# Patient Record
Sex: Male | Born: 1935 | Race: White | Hispanic: No
Health system: Southern US, Community
[De-identification: ages and names within clinical notes are randomized; demographics above are authoritative.]

---

## 1997-12-13 ENCOUNTER — Ambulatory Visit: Admission: RE | Admit: 1997-12-13 | Discharge: 1997-12-13 | Payer: Self-pay | Admitting: *Deleted

## 2003-10-26 ENCOUNTER — Encounter: Payer: Self-pay | Admitting: Cardiology

## 2003-11-26 ENCOUNTER — Encounter: Payer: Self-pay | Admitting: Cardiology

## 2003-12-10 ENCOUNTER — Encounter: Payer: Self-pay | Admitting: General Practice

## 2003-12-26 ENCOUNTER — Encounter: Payer: Self-pay | Admitting: Cardiology

## 2003-12-26 ENCOUNTER — Encounter: Payer: Self-pay | Admitting: General Practice

## 2004-01-26 ENCOUNTER — Encounter: Payer: Self-pay | Admitting: Cardiology

## 2004-02-26 ENCOUNTER — Encounter: Payer: Self-pay | Admitting: Cardiology

## 2004-03-25 ENCOUNTER — Encounter: Payer: Self-pay | Admitting: Cardiology

## 2004-04-25 ENCOUNTER — Encounter: Payer: Self-pay | Admitting: Cardiology

## 2004-05-25 ENCOUNTER — Encounter: Payer: Self-pay | Admitting: Cardiology

## 2004-06-25 ENCOUNTER — Encounter: Payer: Self-pay | Admitting: Cardiology

## 2004-07-25 ENCOUNTER — Encounter: Payer: Self-pay | Admitting: Cardiology

## 2004-08-25 ENCOUNTER — Encounter: Payer: Self-pay | Admitting: Cardiology

## 2004-09-29 ENCOUNTER — Encounter: Payer: Self-pay | Admitting: Cardiology

## 2004-10-15 ENCOUNTER — Ambulatory Visit: Payer: Self-pay | Admitting: Internal Medicine

## 2004-10-25 ENCOUNTER — Encounter: Payer: Self-pay | Admitting: Cardiology

## 2004-11-25 ENCOUNTER — Encounter: Payer: Self-pay | Admitting: Cardiology

## 2004-12-31 ENCOUNTER — Encounter: Payer: Self-pay | Admitting: Cardiology

## 2005-01-22 ENCOUNTER — Ambulatory Visit: Payer: Self-pay | Admitting: Internal Medicine

## 2005-01-25 ENCOUNTER — Encounter: Payer: Self-pay | Admitting: Cardiology

## 2005-02-25 ENCOUNTER — Encounter: Payer: Self-pay | Admitting: Cardiology

## 2005-03-25 ENCOUNTER — Encounter: Payer: Self-pay | Admitting: Cardiology

## 2005-04-25 ENCOUNTER — Encounter: Payer: Self-pay | Admitting: Cardiology

## 2005-05-25 ENCOUNTER — Encounter: Payer: Self-pay | Admitting: Cardiology

## 2005-06-25 ENCOUNTER — Encounter: Payer: Self-pay | Admitting: Cardiology

## 2005-07-25 ENCOUNTER — Encounter: Payer: Self-pay | Admitting: Cardiology

## 2005-08-25 ENCOUNTER — Encounter: Payer: Self-pay | Admitting: Cardiology

## 2005-09-25 ENCOUNTER — Encounter: Payer: Self-pay | Admitting: Cardiology

## 2005-10-25 ENCOUNTER — Encounter: Payer: Self-pay | Admitting: Cardiology

## 2005-11-25 ENCOUNTER — Encounter: Payer: Self-pay | Admitting: Cardiology

## 2005-12-25 ENCOUNTER — Encounter: Payer: Self-pay | Admitting: Cardiology

## 2006-01-25 ENCOUNTER — Encounter: Payer: Self-pay | Admitting: Cardiology

## 2006-02-25 ENCOUNTER — Encounter: Payer: Self-pay | Admitting: Cardiology

## 2006-03-26 ENCOUNTER — Encounter: Payer: Self-pay | Admitting: Cardiology

## 2006-04-26 ENCOUNTER — Encounter: Payer: Self-pay | Admitting: Cardiology

## 2006-05-26 ENCOUNTER — Encounter: Payer: Self-pay | Admitting: Cardiology

## 2006-06-26 ENCOUNTER — Encounter: Payer: Self-pay | Admitting: Cardiology

## 2006-07-26 ENCOUNTER — Encounter: Payer: Self-pay | Admitting: Cardiology

## 2006-08-26 ENCOUNTER — Encounter: Payer: Self-pay | Admitting: Cardiology

## 2006-09-26 ENCOUNTER — Encounter: Payer: Self-pay | Admitting: Cardiology

## 2007-03-08 ENCOUNTER — Ambulatory Visit: Payer: Self-pay | Admitting: Internal Medicine

## 2007-05-12 ENCOUNTER — Ambulatory Visit: Payer: Self-pay | Admitting: Specialist

## 2008-06-03 ENCOUNTER — Ambulatory Visit: Payer: Self-pay | Admitting: Internal Medicine

## 2008-06-06 ENCOUNTER — Ambulatory Visit: Payer: Self-pay | Admitting: Internal Medicine

## 2008-06-10 ENCOUNTER — Encounter: Payer: Self-pay | Admitting: Internal Medicine

## 2008-06-19 ENCOUNTER — Ambulatory Visit: Payer: Self-pay | Admitting: Vascular Surgery

## 2008-06-25 ENCOUNTER — Encounter: Payer: Self-pay | Admitting: Internal Medicine

## 2008-07-05 ENCOUNTER — Ambulatory Visit: Payer: Self-pay | Admitting: Vascular Surgery

## 2008-08-08 ENCOUNTER — Ambulatory Visit: Payer: Self-pay | Admitting: Vascular Surgery

## 2008-08-13 ENCOUNTER — Inpatient Hospital Stay: Payer: Self-pay | Admitting: Vascular Surgery

## 2008-08-17 ENCOUNTER — Inpatient Hospital Stay: Payer: Self-pay | Admitting: Internal Medicine

## 2008-12-16 ENCOUNTER — Ambulatory Visit: Payer: Self-pay | Admitting: Specialist

## 2009-05-05 ENCOUNTER — Ambulatory Visit: Payer: Self-pay | Admitting: Specialist

## 2009-05-19 ENCOUNTER — Ambulatory Visit: Payer: Self-pay | Admitting: Specialist

## 2009-08-27 ENCOUNTER — Ambulatory Visit: Payer: Self-pay | Admitting: Vascular Surgery

## 2009-09-01 ENCOUNTER — Ambulatory Visit: Payer: Self-pay | Admitting: Internal Medicine

## 2009-09-24 ENCOUNTER — Ambulatory Visit: Payer: Self-pay | Admitting: Urology

## 2009-10-29 ENCOUNTER — Ambulatory Visit: Payer: Self-pay | Admitting: Specialist

## 2010-05-08 ENCOUNTER — Ambulatory Visit: Payer: Self-pay | Admitting: Specialist

## 2010-05-14 ENCOUNTER — Ambulatory Visit: Payer: Self-pay

## 2010-10-14 ENCOUNTER — Inpatient Hospital Stay: Payer: Self-pay | Admitting: Internal Medicine

## 2010-10-19 LAB — PATHOLOGY REPORT

## 2010-11-05 ENCOUNTER — Inpatient Hospital Stay: Payer: Self-pay | Admitting: Internal Medicine

## 2010-11-11 ENCOUNTER — Encounter: Payer: Self-pay | Admitting: Internal Medicine

## 2010-11-26 ENCOUNTER — Encounter: Payer: Self-pay | Admitting: Internal Medicine

## 2010-12-26 ENCOUNTER — Encounter: Payer: Self-pay | Admitting: Internal Medicine

## 2011-10-15 LAB — COMPREHENSIVE METABOLIC PANEL
Albumin: 3.3 g/dL — ABNORMAL LOW (ref 3.4–5.0)
Anion Gap: 10 (ref 7–16)
Bilirubin,Total: 0.8 mg/dL (ref 0.2–1.0)
Calcium, Total: 9.3 mg/dL (ref 8.5–10.1)
Chloride: 101 mmol/L (ref 98–107)
Co2: 29 mmol/L (ref 21–32)
EGFR (African American): 58 — ABNORMAL LOW
EGFR (Non-African Amer.): 50 — ABNORMAL LOW
Glucose: 130 mg/dL — ABNORMAL HIGH (ref 65–99)
Osmolality: 283 (ref 275–301)
Potassium: 3.6 mmol/L (ref 3.5–5.1)
SGOT(AST): 26 U/L (ref 15–37)
SGPT (ALT): 27 U/L (ref 12–78)
Total Protein: 7.6 g/dL (ref 6.4–8.2)

## 2011-10-15 LAB — URINALYSIS, COMPLETE
Blood: NEGATIVE
Glucose,UR: NEGATIVE mg/dL (ref 0–75)
Leukocyte Esterase: NEGATIVE
Nitrite: NEGATIVE
Protein: NEGATIVE
Specific Gravity: 1.015 (ref 1.003–1.030)
Squamous Epithelial: <1
WBC UR: <1 /HPF (ref 0–5)

## 2011-10-15 LAB — CBC
HGB: 15.8 g/dL (ref 13.0–18.0)
MCH: 32.8 pg (ref 26.0–34.0)
MCHC: 34.4 g/dL (ref 32.0–36.0)
Platelet: 276 10*3/uL (ref 150–440)

## 2011-10-15 LAB — MAGNESIUM: Magnesium: 2.2 mg/dL

## 2011-10-15 LAB — LIPASE, BLOOD: Lipase: 41 U/L — ABNORMAL LOW (ref 73–393)

## 2011-10-16 LAB — CBC WITH DIFFERENTIAL/PLATELET
Basophil #: 0.1 10*3/uL (ref 0.0–0.1)
Eosinophil %: 4.3 %
Lymphocyte #: 1.5 10*3/uL (ref 1.0–3.6)
Lymphocyte %: 24.2 %
MCV: 95 fL (ref 80–100)
Monocyte %: 9.7 %
Neutrophil #: 3.6 10*3/uL (ref 1.4–6.5)
Neutrophil %: 60.9 %
Platelet: 206 10*3/uL (ref 150–440)
RBC: 4.18 10*6/uL — ABNORMAL LOW (ref 4.40–5.90)
RDW: 14.1 % (ref 11.5–14.5)
WBC: 6 10*3/uL (ref 3.8–10.6)

## 2011-10-16 LAB — BASIC METABOLIC PANEL
Anion Gap: 8 (ref 7–16)
BUN: 17 mg/dL (ref 7–18)
Co2: 29 mmol/L (ref 21–32)
EGFR (Non-African Amer.): 55 — ABNORMAL LOW
Glucose: 92 mg/dL (ref 65–99)
Potassium: 3.2 mmol/L — ABNORMAL LOW (ref 3.5–5.1)
Sodium: 142 mmol/L (ref 136–145)

## 2011-10-16 LAB — HEMOGLOBIN: HGB: 13 g/dL (ref 13.0–18.0)

## 2011-10-17 ENCOUNTER — Inpatient Hospital Stay: Payer: Self-pay | Admitting: Internal Medicine

## 2011-10-17 LAB — BASIC METABOLIC PANEL
Anion Gap: 8 (ref 7–16)
Calcium, Total: 8 mg/dL — ABNORMAL LOW (ref 8.5–10.1)
Co2: 26 mmol/L (ref 21–32)
Creatinine: 1.14 mg/dL (ref 0.60–1.30)
EGFR (African American): >60
EGFR (Non-African Amer.): 58 — ABNORMAL LOW
Glucose: 94 mg/dL (ref 65–99)

## 2011-10-17 LAB — CBC WITH DIFFERENTIAL/PLATELET
Basophil #: 0 10*3/uL (ref 0.0–0.1)
Eosinophil #: 0.2 10*3/uL (ref 0.0–0.7)
Lymphocyte #: 1.3 10*3/uL (ref 1.0–3.6)
MCH: 31.9 pg (ref 26.0–34.0)
MCHC: 33.3 g/dL (ref 32.0–36.0)
MCV: 96 fL (ref 80–100)
Monocyte #: 0.5 x10 3/mm (ref 0.2–1.0)
Platelet: 199 10*3/uL (ref 150–440)
RDW: 14.1 % (ref 11.5–14.5)
WBC: 6.1 10*3/uL (ref 3.8–10.6)

## 2011-10-17 LAB — HEMOGLOBIN: HGB: 11.8 g/dL — ABNORMAL LOW (ref 13.0–18.0)

## 2011-10-18 LAB — CBC WITH DIFFERENTIAL/PLATELET
Basophil #: 0.1 10*3/uL (ref 0.0–0.1)
Eosinophil #: 0.2 10*3/uL (ref 0.0–0.7)
HCT: 34.6 % — ABNORMAL LOW (ref 40.0–52.0)
HGB: 11.6 g/dL — ABNORMAL LOW (ref 13.0–18.0)
Lymphocyte #: 1.6 10*3/uL (ref 1.0–3.6)
Lymphocyte %: 25.3 %
MCHC: 33.4 g/dL (ref 32.0–36.0)
Monocyte #: 0.5 x10 3/mm (ref 0.2–1.0)
Monocyte %: 8.4 %
Neutrophil #: 3.9 10*3/uL (ref 1.4–6.5)
Neutrophil %: 62.4 %
Platelet: 195 10*3/uL (ref 150–440)
RBC: 3.61 10*6/uL — ABNORMAL LOW (ref 4.40–5.90)

## 2011-10-19 LAB — HEMOGLOBIN: HGB: 11.8 g/dL — ABNORMAL LOW (ref 13.0–18.0)

## 2011-10-20 LAB — PATHOLOGY REPORT

## 2011-10-21 LAB — URINALYSIS, COMPLETE
Bacteria: NONE SEEN
Glucose,UR: NEGATIVE mg/dL (ref 0–75)
Nitrite: NEGATIVE
RBC,UR: 7 /HPF (ref 0–5)
Specific Gravity: 1.005 (ref 1.003–1.030)
WBC UR: 71 /HPF (ref 0–5)

## 2011-10-21 LAB — CBC WITH DIFFERENTIAL/PLATELET
Basophil #: 0 10*3/uL (ref 0.0–0.1)
Eosinophil #: 0 10*3/uL (ref 0.0–0.7)
HCT: 40.4 % (ref 40.0–52.0)
HGB: 13.6 g/dL (ref 13.0–18.0)
Lymphocyte %: 3.1 %
MCHC: 33.8 g/dL (ref 32.0–36.0)
Monocyte %: 7.1 %
Neutrophil #: 14.8 10*3/uL — ABNORMAL HIGH (ref 1.4–6.5)
Neutrophil %: 89.5 %
Platelet: 209 10*3/uL (ref 150–440)
RBC: 4.26 10*6/uL — ABNORMAL LOW (ref 4.40–5.90)
RDW: 14.2 % (ref 11.5–14.5)
WBC: 16.5 10*3/uL — ABNORMAL HIGH (ref 3.8–10.6)

## 2011-10-21 LAB — COMPREHENSIVE METABOLIC PANEL
Bilirubin,Total: 0.7 mg/dL (ref 0.2–1.0)
Calcium, Total: 8.6 mg/dL (ref 8.5–10.1)
Chloride: 109 mmol/L — ABNORMAL HIGH (ref 98–107)
Co2: 26 mmol/L (ref 21–32)
Creatinine: 1.77 mg/dL — ABNORMAL HIGH (ref 0.60–1.30)
EGFR (African American): 39 — ABNORMAL LOW
Glucose: 142 mg/dL — ABNORMAL HIGH (ref 65–99)
Osmolality: 291 (ref 275–301)
SGPT (ALT): 12 U/L (ref 12–78)
Total Protein: 6.2 g/dL — ABNORMAL LOW (ref 6.4–8.2)

## 2011-10-22 ENCOUNTER — Inpatient Hospital Stay: Payer: Self-pay | Admitting: Internal Medicine

## 2011-10-22 ENCOUNTER — Ambulatory Visit: Payer: Self-pay | Admitting: Internal Medicine

## 2011-10-23 LAB — CBC WITH DIFFERENTIAL/PLATELET
Basophil %: 0.4 %
Eosinophil #: 0.1 10*3/uL (ref 0.0–0.7)
Eosinophil %: 0.6 %
HGB: 11.5 g/dL — ABNORMAL LOW (ref 13.0–18.0)
Lymphocyte #: 0.7 10*3/uL — ABNORMAL LOW (ref 1.0–3.6)
Lymphocyte %: 6.2 %
MCV: 96 fL (ref 80–100)
Neutrophil #: 9.9 10*3/uL — ABNORMAL HIGH (ref 1.4–6.5)
Neutrophil %: 86.6 %
RBC: 3.71 10*6/uL — ABNORMAL LOW (ref 4.40–5.90)
WBC: 11.4 10*3/uL — ABNORMAL HIGH (ref 3.8–10.6)

## 2011-10-23 LAB — BASIC METABOLIC PANEL
BUN: 19 mg/dL — ABNORMAL HIGH (ref 7–18)
Calcium, Total: 8 mg/dL — ABNORMAL LOW (ref 8.5–10.1)
Chloride: 114 mmol/L — ABNORMAL HIGH (ref 98–107)
EGFR (Non-African Amer.): 48 — ABNORMAL LOW
Osmolality: 297 (ref 275–301)
Potassium: 3.6 mmol/L (ref 3.5–5.1)
Sodium: 148 mmol/L — ABNORMAL HIGH (ref 136–145)

## 2011-10-23 LAB — URINE CULTURE

## 2011-10-24 LAB — BASIC METABOLIC PANEL
Anion Gap: 10 (ref 7–16)
Chloride: 115 mmol/L — ABNORMAL HIGH (ref 98–107)
Co2: 21 mmol/L (ref 21–32)
Creatinine: 1.39 mg/dL — ABNORMAL HIGH (ref 0.60–1.30)
EGFR (Non-African Amer.): 46 — ABNORMAL LOW
Glucose: 128 mg/dL — ABNORMAL HIGH (ref 65–99)
Potassium: 4.1 mmol/L (ref 3.5–5.1)
Sodium: 146 mmol/L — ABNORMAL HIGH (ref 136–145)

## 2011-10-24 LAB — CULTURE, BLOOD (SINGLE)

## 2011-10-25 LAB — CBC WITH DIFFERENTIAL/PLATELET
Basophil #: 0 10*3/uL (ref 0.0–0.1)
Basophil %: 0.3 %
Eosinophil #: 0 10*3/uL (ref 0.0–0.7)
Eosinophil %: 0 %
HCT: 36.1 % — ABNORMAL LOW (ref 40.0–52.0)
Lymphocyte %: 8.2 %
MCH: 32.1 pg (ref 26.0–34.0)
MCHC: 33.7 g/dL (ref 32.0–36.0)
Monocyte #: 0.9 x10 3/mm (ref 0.2–1.0)
Neutrophil #: 10.4 10*3/uL — ABNORMAL HIGH (ref 1.4–6.5)
Neutrophil %: 84.5 %
RBC: 3.79 10*6/uL — ABNORMAL LOW (ref 4.40–5.90)
RDW: 14.6 % — ABNORMAL HIGH (ref 11.5–14.5)
WBC: 12.4 10*3/uL — ABNORMAL HIGH (ref 3.8–10.6)

## 2011-10-25 LAB — URINALYSIS, COMPLETE
Glucose,UR: NEGATIVE mg/dL (ref 0–75)
Nitrite: NEGATIVE
Protein: 30
Specific Gravity: 1.018 (ref 1.003–1.030)
Squamous Epithelial: NONE SEEN

## 2011-10-25 LAB — BASIC METABOLIC PANEL
Anion Gap: 13 (ref 7–16)
BUN: 28 mg/dL — ABNORMAL HIGH (ref 7–18)
Creatinine: 1.34 mg/dL — ABNORMAL HIGH (ref 0.60–1.30)
EGFR (African American): 55 — ABNORMAL LOW
Glucose: 132 mg/dL — ABNORMAL HIGH (ref 65–99)
Potassium: 4.3 mmol/L (ref 3.5–5.1)
Sodium: 149 mmol/L — ABNORMAL HIGH (ref 136–145)

## 2011-10-26 ENCOUNTER — Ambulatory Visit: Payer: Self-pay | Admitting: Internal Medicine

## 2011-10-27 LAB — BASIC METABOLIC PANEL
BUN: 24 mg/dL — ABNORMAL HIGH (ref 7–18)
Calcium, Total: 8.2 mg/dL — ABNORMAL LOW (ref 8.5–10.1)
Chloride: 114 mmol/L — ABNORMAL HIGH (ref 98–107)
EGFR (African American): >60
Osmolality: 298 (ref 275–301)
Potassium: 4.7 mmol/L (ref 3.5–5.1)
Sodium: 148 mmol/L — ABNORMAL HIGH (ref 136–145)

## 2011-10-29 LAB — CBC WITH DIFFERENTIAL/PLATELET
Basophil %: 0.3 %
Eosinophil #: 0.1 10*3/uL (ref 0.0–0.7)
Eosinophil %: 1.1 %
HCT: 33.6 % — ABNORMAL LOW (ref 40.0–52.0)
HGB: 11.3 g/dL — ABNORMAL LOW (ref 13.0–18.0)
Lymphocyte #: 1.1 10*3/uL (ref 1.0–3.6)
MCH: 31.5 pg (ref 26.0–34.0)
MCHC: 33.7 g/dL (ref 32.0–36.0)
MCV: 93 fL (ref 80–100)
Monocyte #: 0.6 x10 3/mm (ref 0.2–1.0)
Monocyte %: 6.8 %
Neutrophil #: 6.8 10*3/uL — ABNORMAL HIGH (ref 1.4–6.5)
RBC: 3.59 10*6/uL — ABNORMAL LOW (ref 4.40–5.90)

## 2011-10-29 LAB — BASIC METABOLIC PANEL
Anion Gap: 7 (ref 7–16)
Calcium, Total: 7.6 mg/dL — ABNORMAL LOW (ref 8.5–10.1)
Co2: 28 mmol/L (ref 21–32)
Creatinine: 1.06 mg/dL (ref 0.60–1.30)
EGFR (African American): >60
EGFR (Non-African Amer.): >60
Glucose: 125 mg/dL — ABNORMAL HIGH (ref 65–99)
Osmolality: 278 (ref 275–301)
Sodium: 139 mmol/L (ref 136–145)

## 2011-11-26 ENCOUNTER — Ambulatory Visit: Payer: Self-pay | Admitting: Internal Medicine

## 2011-11-26 DEATH — deceased

## 2012-02-26 IMAGING — XA IR VASCULAR PROCEDURE
15 of 24 series · 15 of 24 positions shown · IV contrast (IODINE)
Comparison: none

[Series 3: aorta · 1 of 2 slices shown (1 of 9)]
[im 1/2]
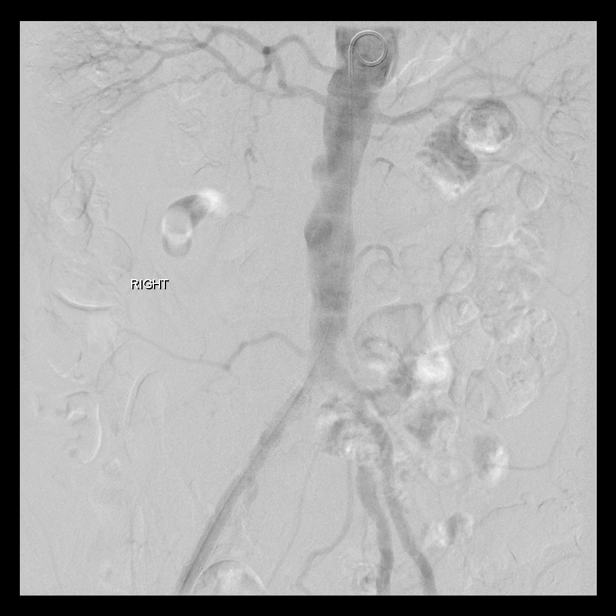

[Series 5: aorta · 1 of 2 slices shown (2 of 9)]
[im 1/2]
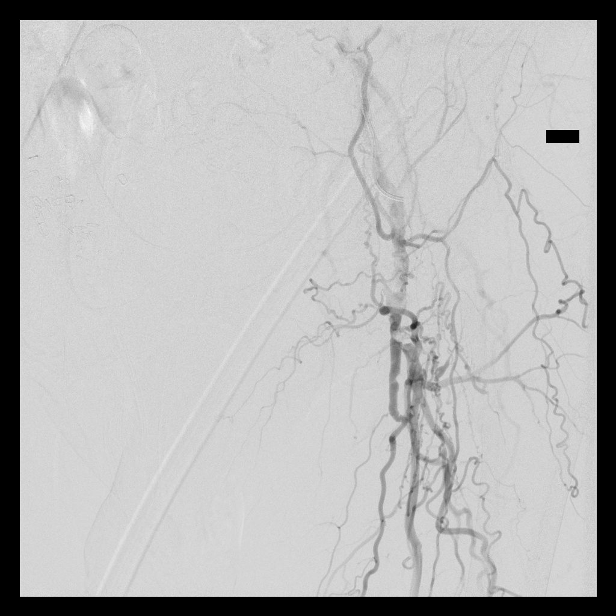

[Series 7: aorta · 1 of 2 slices shown (3 of 9)]
[im 1/2]
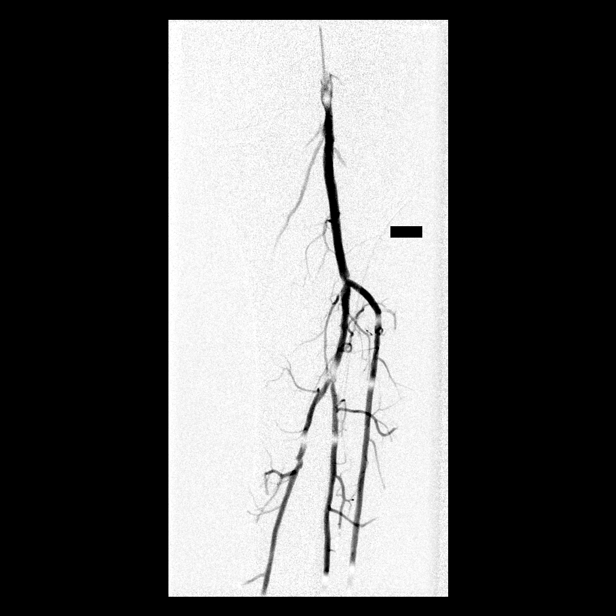

[Series 8: aorta · 1 of 2 slices shown (4 of 9)]
[im 1/2]
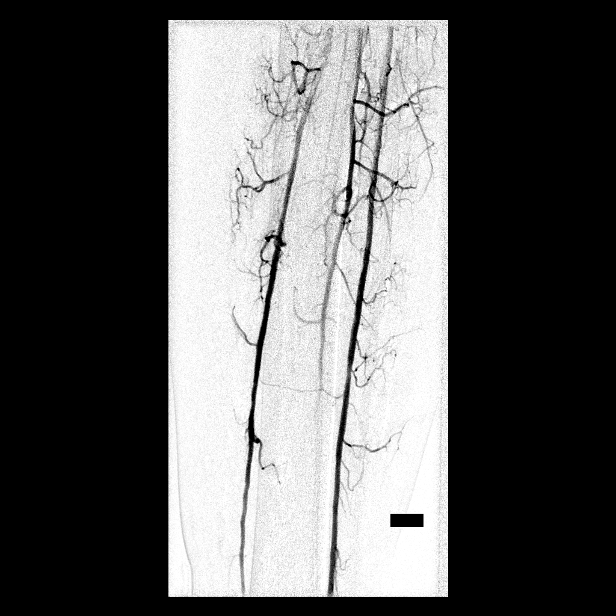

[Series 10: fl  angio · 1 of 1 slices shown (1 of 5)]
[im 1/1]
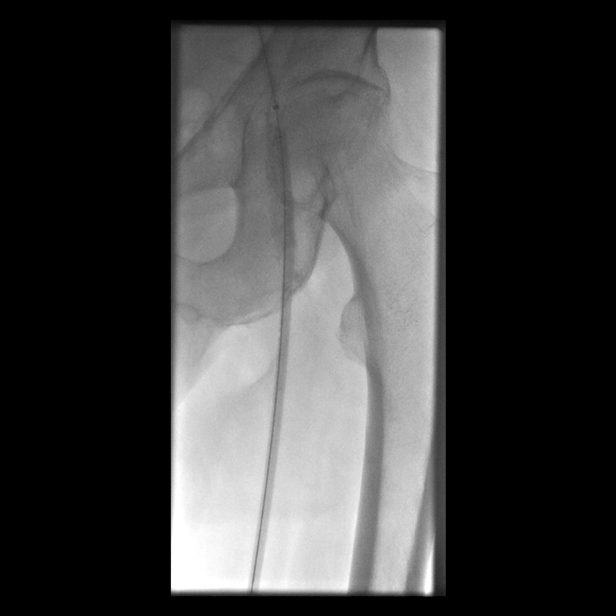

[Series 11: fl  angio · 1 of 2 slices shown (2 of 5)]
[im 1/2]
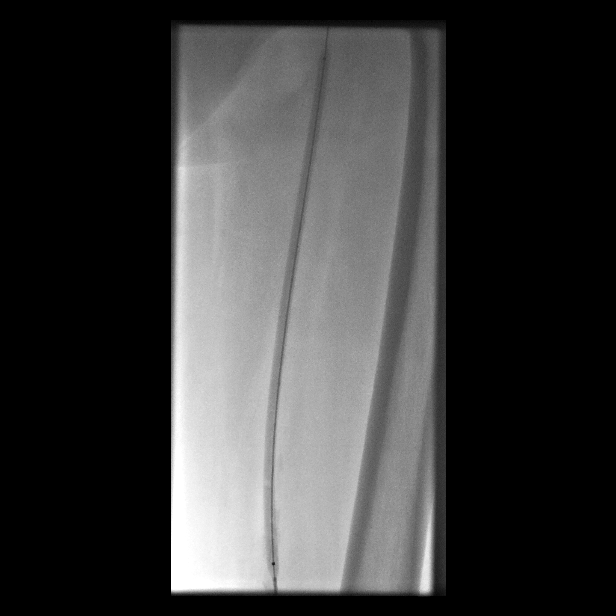

[Series 13: fl  angio · 1 of 1 slices shown (3 of 5)]
[im 1/1]
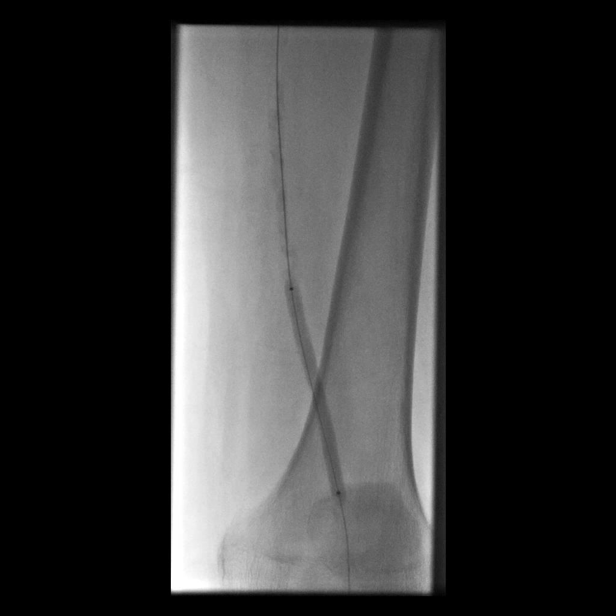

[Series 15: aorta · 1 of 2 slices shown (5 of 9)]
[im 1/2]
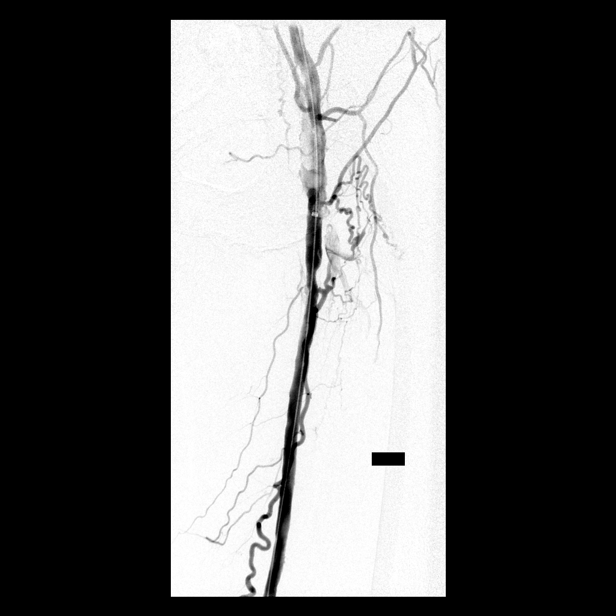

[Series 16: aorta · 1 of 2 slices shown (6 of 9)]
[im 1/2]
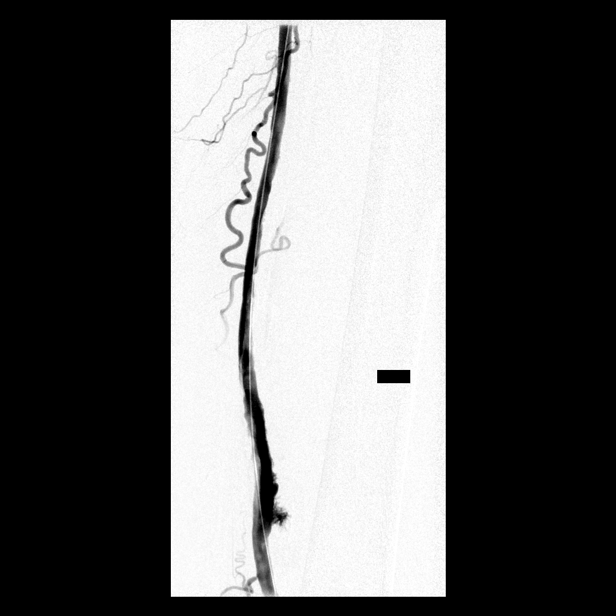

[Series 18: fl  angio · 1 of 1 slices shown (4 of 5)]
[im 1/1]
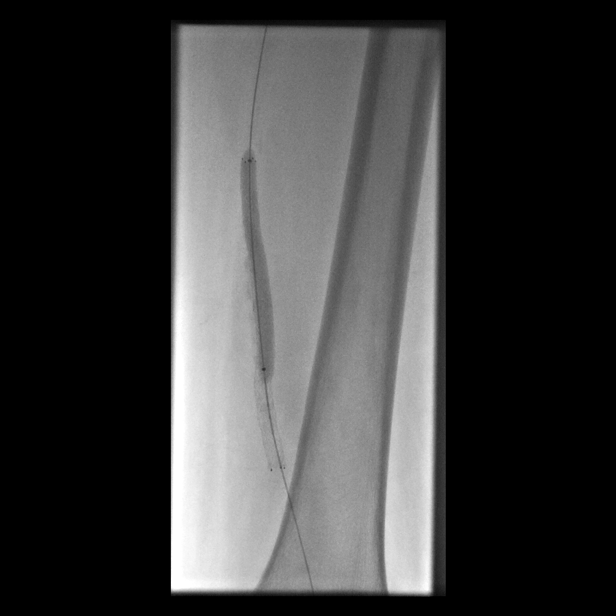

[Series 19: aorta · 1 of 2 slices shown (7 of 9)]
[im 1/2]
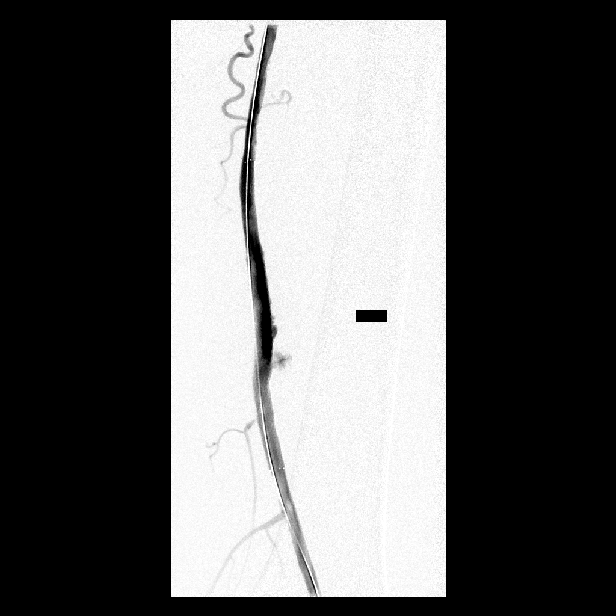

[Series 21: aorta · 1 of 2 slices shown (8 of 9)]
[im 1/2]
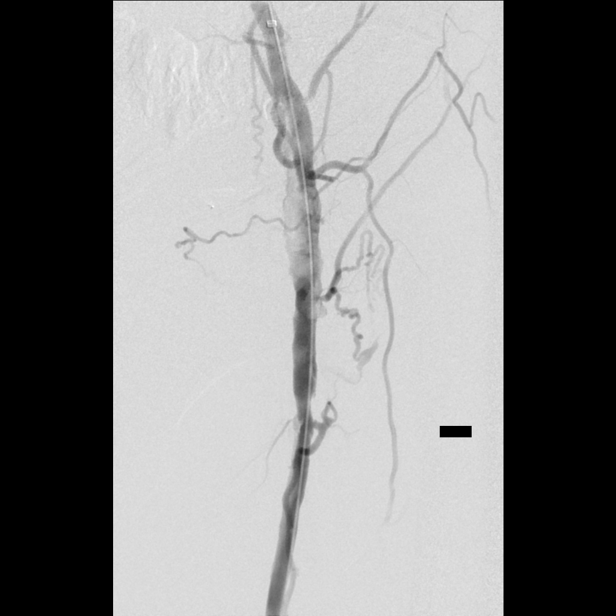

[Series 23: aorta · 1 of 2 slices shown (9 of 9)]
[im 1/2]
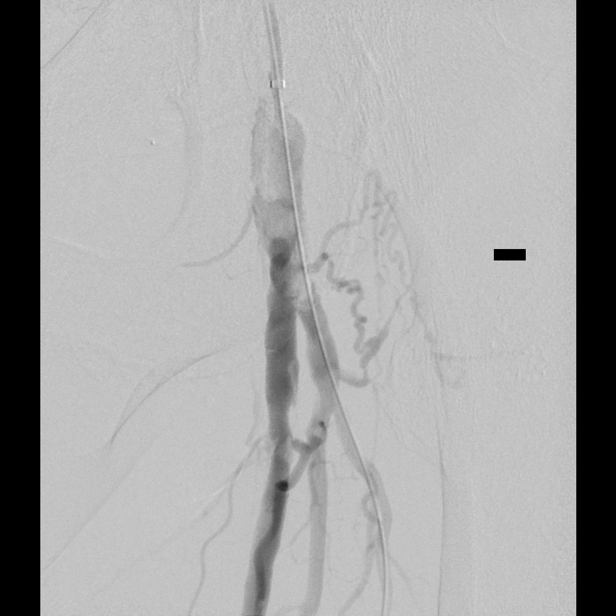

[Series 24: fl  angio · 1 of 1 slices shown (5 of 5)]
[im 1/1]
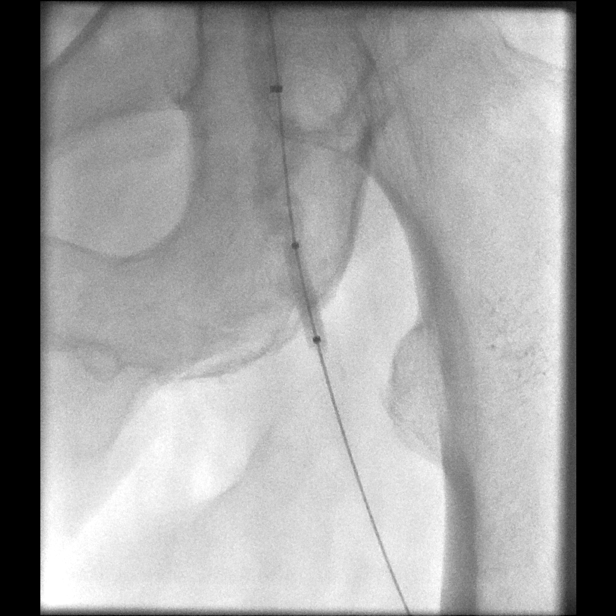

[Series 26: sfa · 1 of 2 slices shown]
[im 1/2]
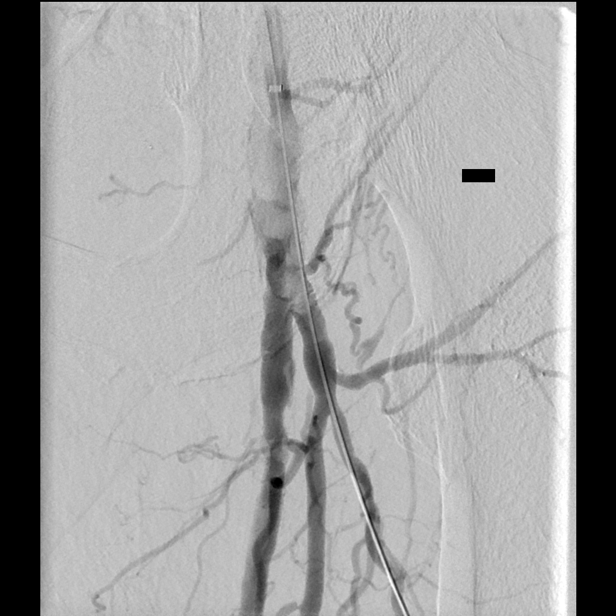

[15 of 24 positions shown; findings below may reference images not displayed]

IMAGES IMPORTED FROM THE [employer] WORKFLOW SYSTEM
NO DICTATION FOR STUDY

## 2012-03-02 IMAGING — US US EXTREM LOW VENOUS*L*
1 series · 17 of 24 positions shown · non-contrast
Comparison: none

REASON FOR EXAM: STAT CR PGER [id number] post procedure edema LEFT eval DVT
COMMENTS:

[Series 1: us extrem low venous*left* · 17 of 24 slices shown]
[im 1/24]
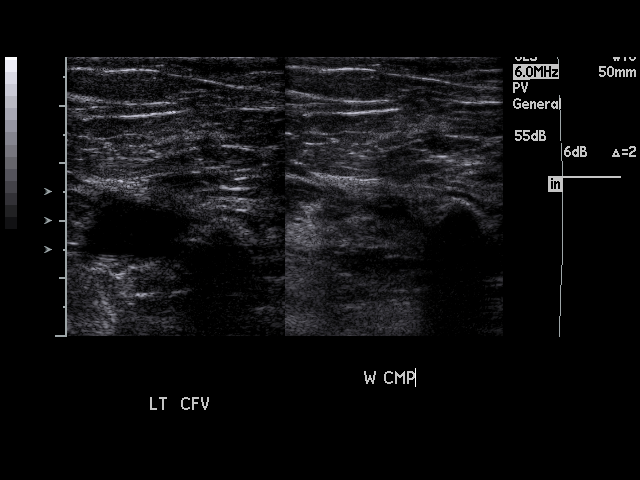
[im 3/24]
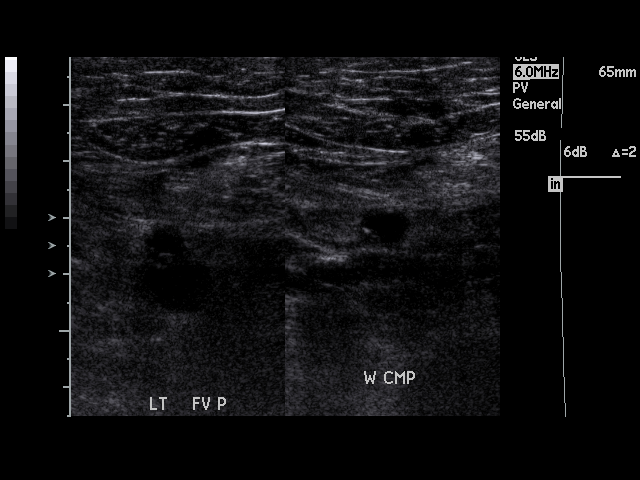
[im 4/24]
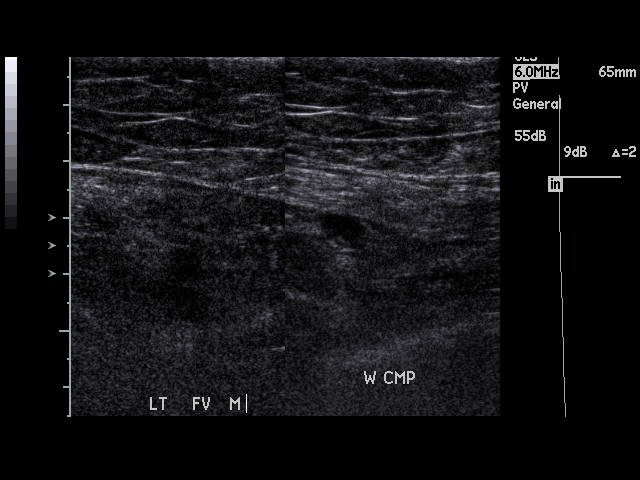
[im 5/24]
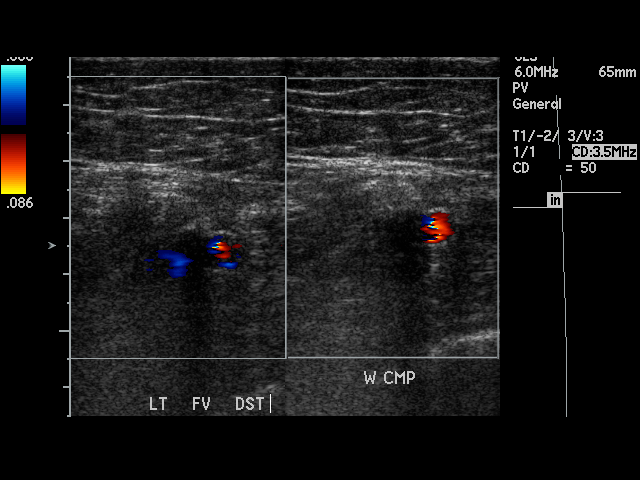
[im 7/24]
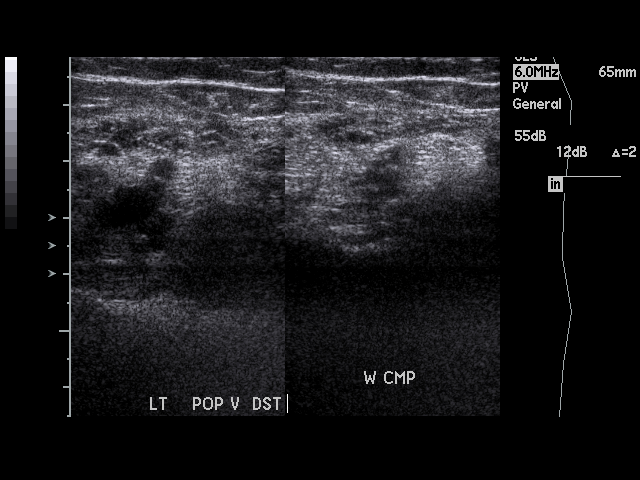
[im 8/24]
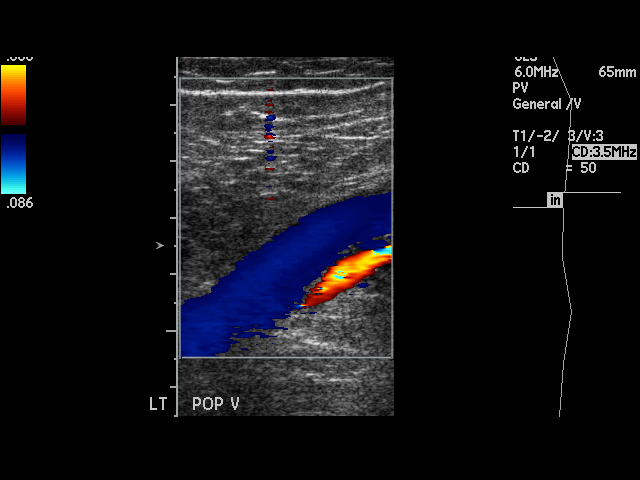
[im 10/24]
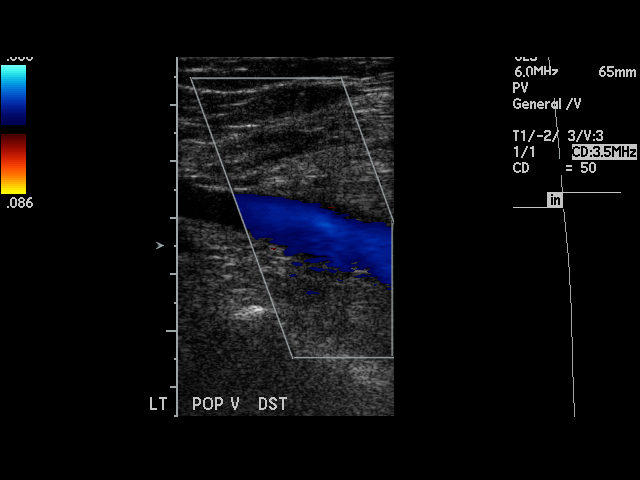
[im 11/24]
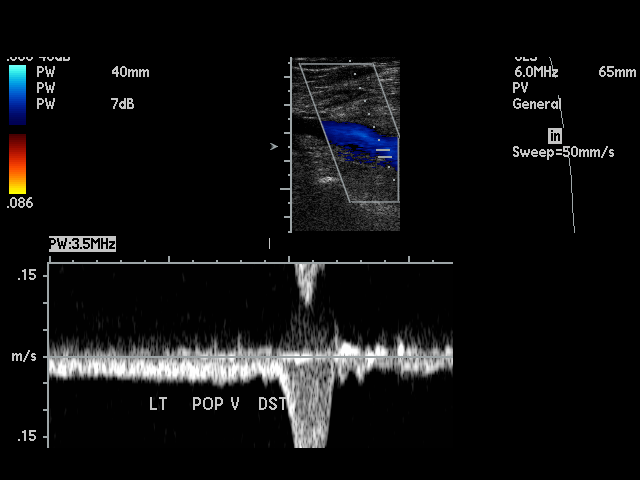
[im 13/24]
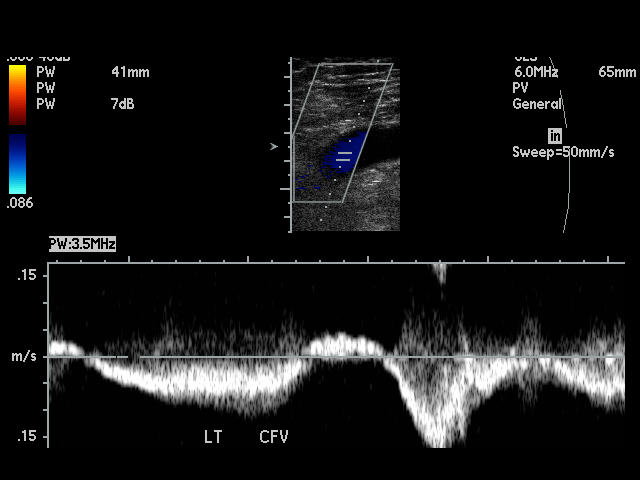
[im 14/24]
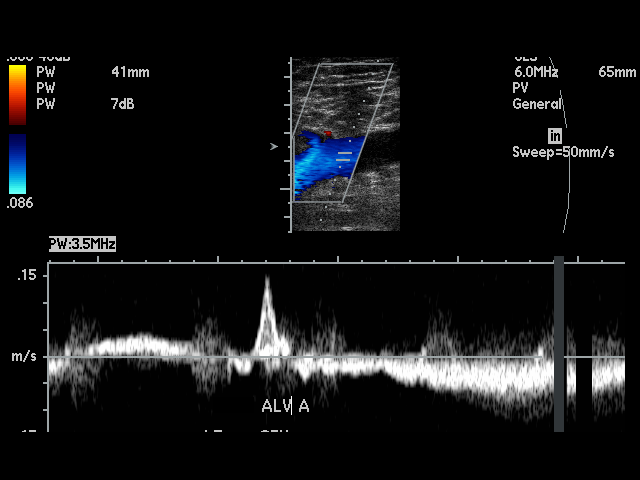
[im 15/24]
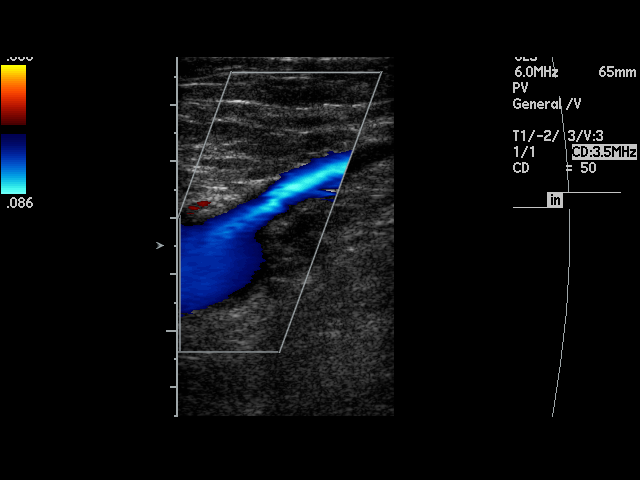
[im 17/24]
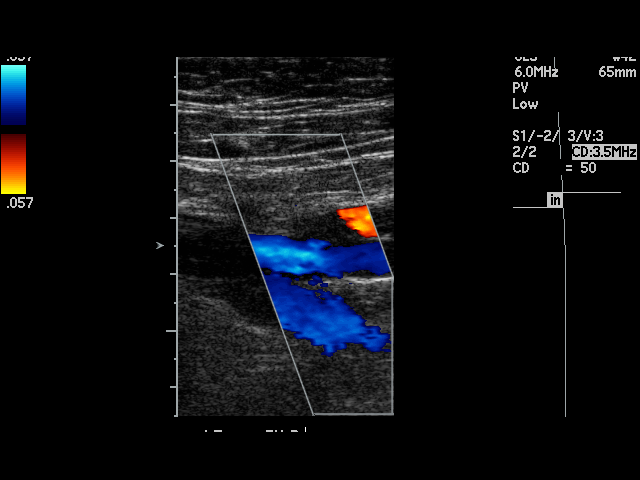
[im 18/24]
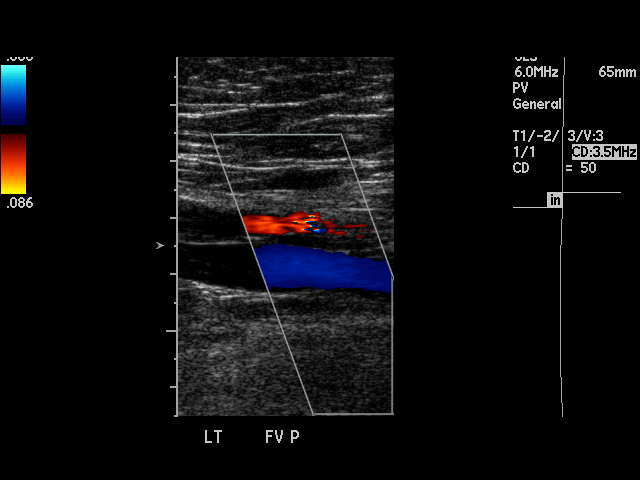
[im 20/24]
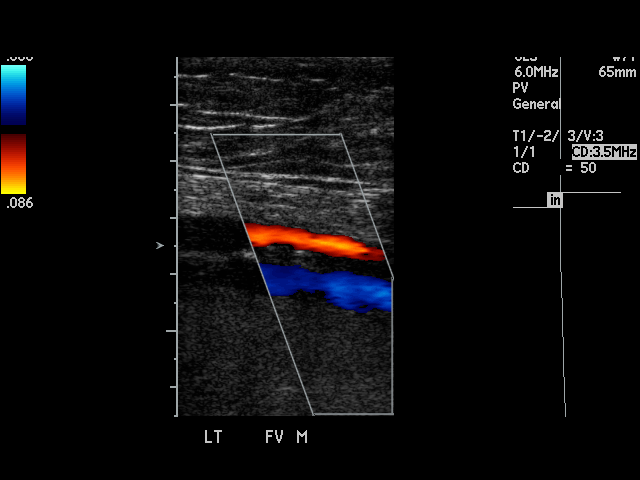
[im 21/24]
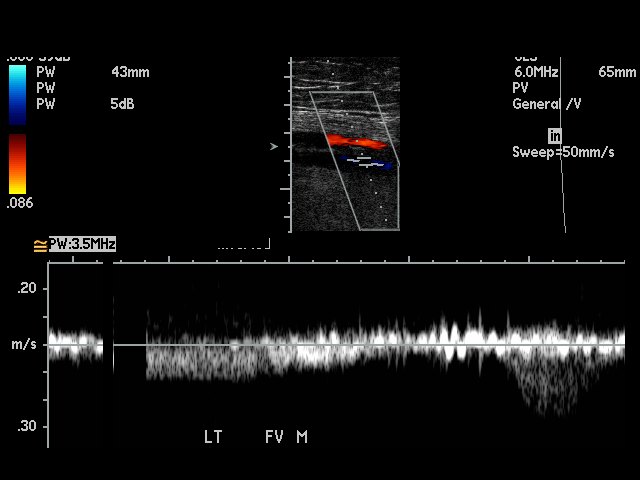
[im 22/24]
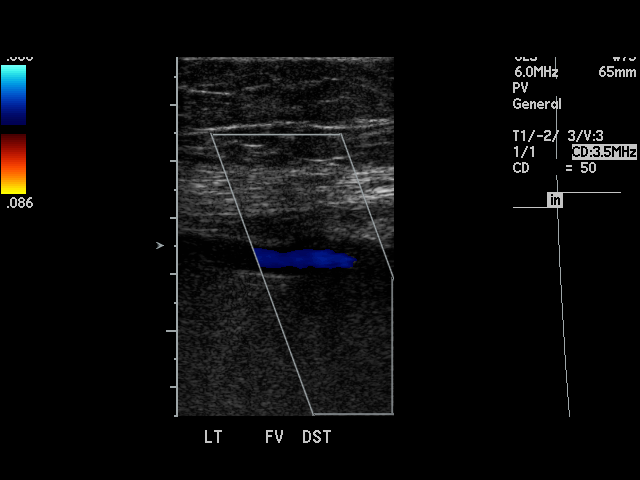
[im 24/24]
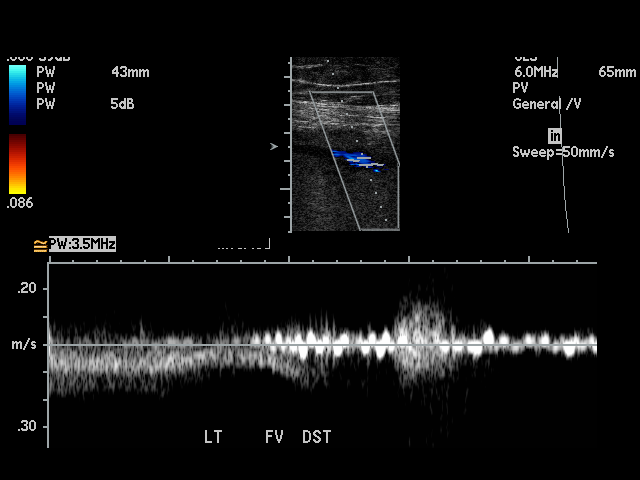

[17 of 24 positions shown; findings below may reference images not displayed]

PROCEDURE:     US  - US DOPPLER LOW EXTR LEFT  - September 01, 2009  [DATE]

RESULT:     Technique: Gray scale, Duplex color flow and SPECTRAL waveform
imaging was performed of the deep venous structures of the LEFT lower
extremity.

There is not evidence of increased echogenicity, non-compressibility,
abnormal waveform or abnormal grayscale flow with the interrogated deep
venous structures of the LEFT lower extremity. There is appropriate response
to Valsalva and augmentation within the interrogated vessels.
IMPRESSION: 1. No sonographic evidence of a deep venous thrombus within the interrogated
vessels of the LEFT lower extremity.

## 2013-04-14 IMAGING — CR DG CHEST 1V PORT
1 series · 1 of 1 positions shown · non-contrast
Comparison: none

REASON FOR EXAM: preop, hypertension
COMMENTS:

PROCEDURE:     DXR - DXR PORTABLE CHEST SINGLE VIEW  - October 14, 2010  [DATE]
RESULT:     The lungs are clear. The heart and pulmonary vessels are normal.
The bony and mediastinal structures are unremarkable. There is no effusion.
There is no pneumothorax or evidence of congestive failure.

[view not recorded]
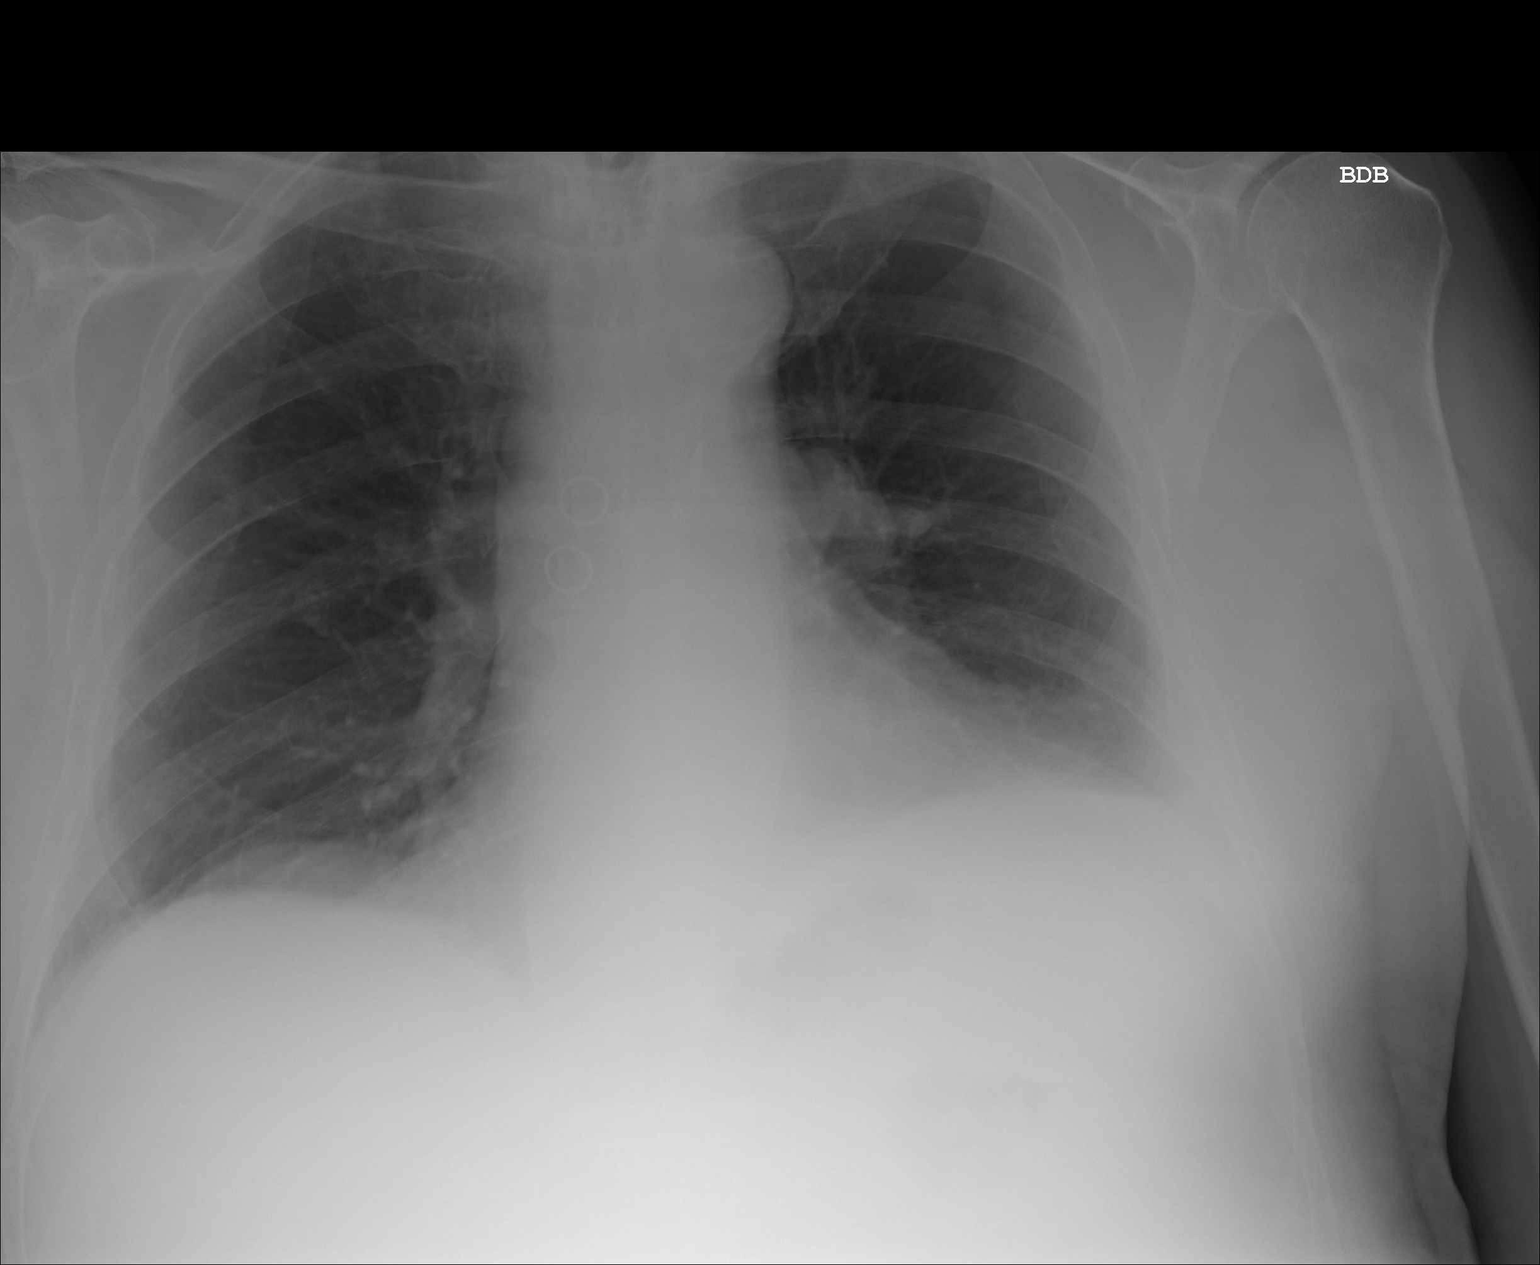

[1 of 1 positions shown; findings below may reference images not displayed]

IMPRESSION: No acute cardiopulmonary disease.

## 2013-04-15 IMAGING — CR DG C-ARM 1-60 MIN
1 series · 2 of 2 positions shown · non-contrast
Comparison: none

REASON FOR EXAM: back pain
COMMENTS:

PROCEDURE:     DXR - DXR C-ARM FOR KYPHOPLASTY  - October 15, 2010  [DATE]
RESULT:     C-arm images demonstrate kyphoplasty material in the L2
vertebral body.

[Series 6001: (person_name)/(person_name) · 2 of 2 slices shown]
[im 1/2]
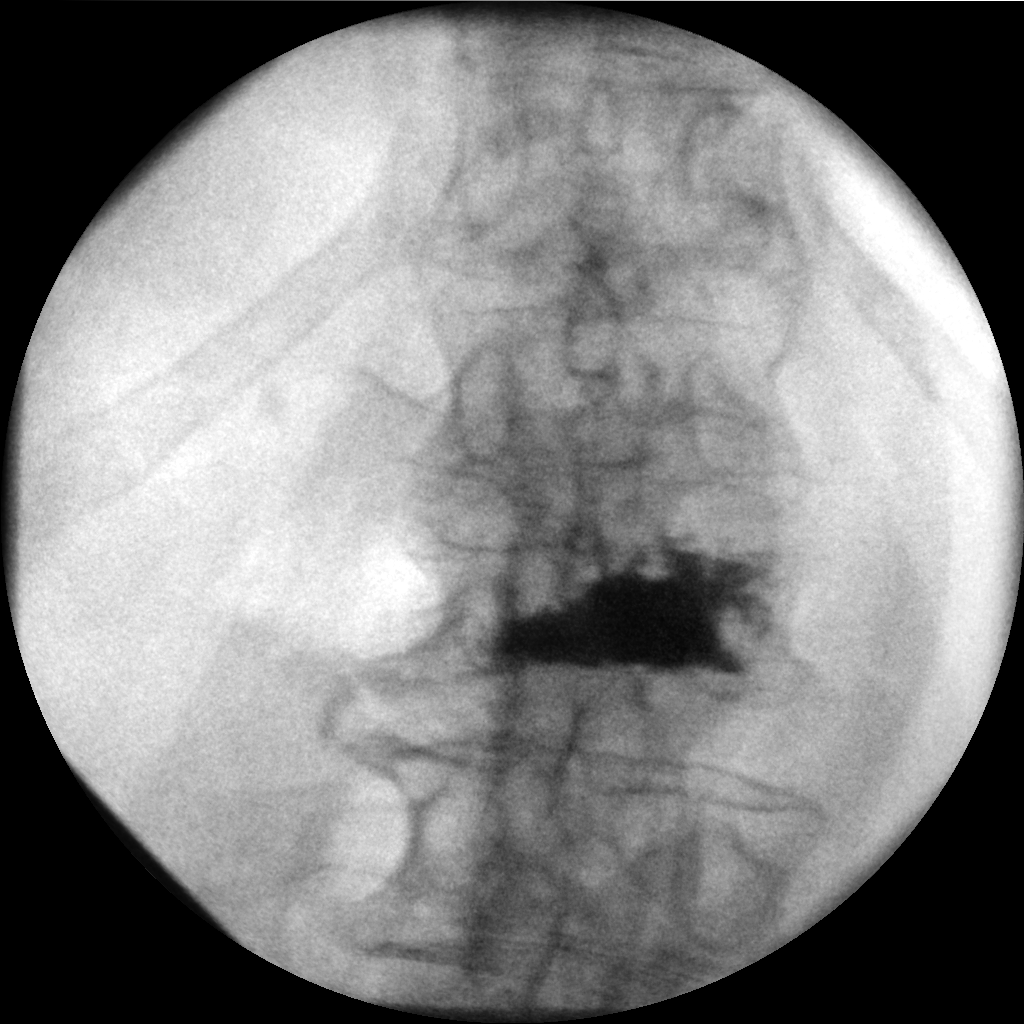
[im 2/2]
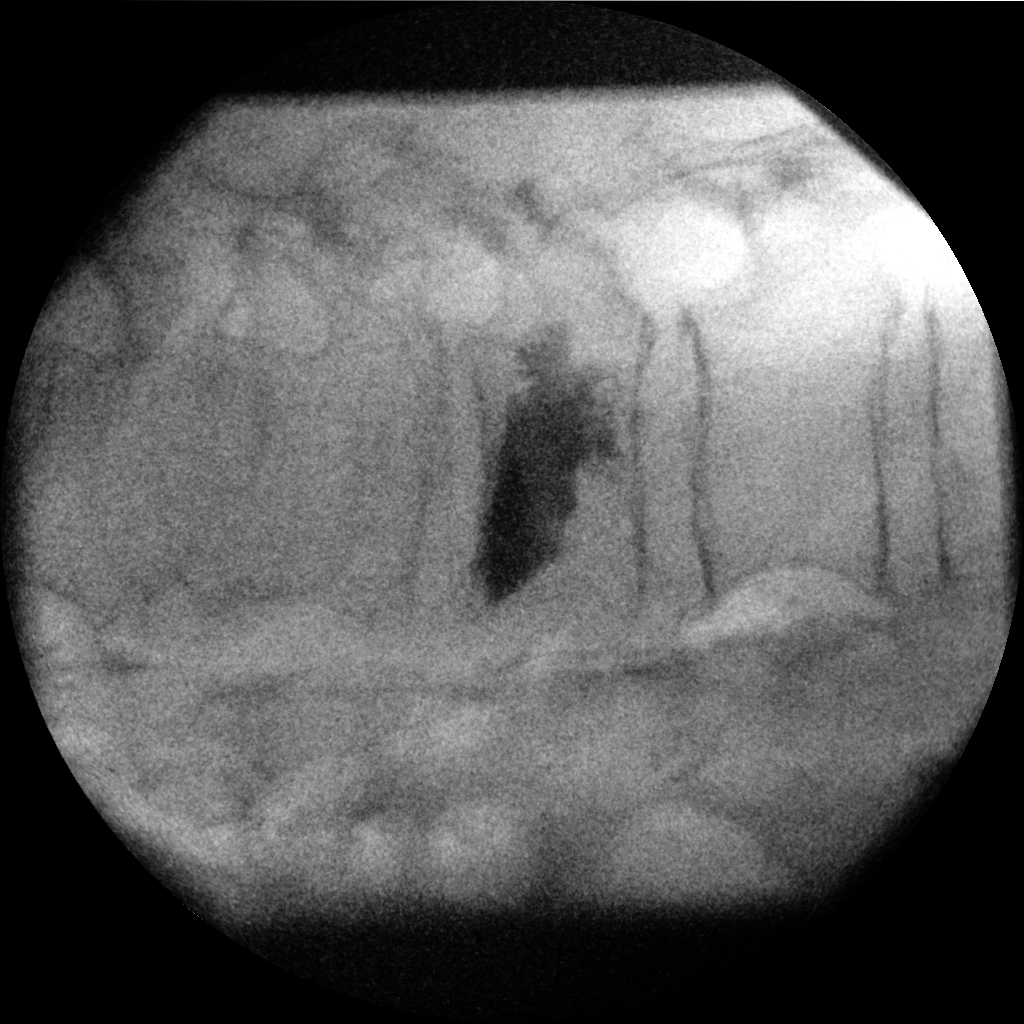

[2 of 2 positions shown; findings below may reference images not displayed]

IMPRESSION: 1. Please see above.

## 2014-05-14 NOTE — H&P (Signed)
PATIENT NAME:  Jesus [name], [name] MR#:  [id number] DATE OF BIRTH:  02/12/1923  DATE OF ADMISSION:  10/22/2011  PRIMARY CARE PHYSICIAN: Dr. Yates DecampJohn Walker   REFERRING PHYSICIAN: Dr. Olivia MackieGina Martin  CHIEF COMPLAINT: Altered mental status, lethargy, vomiting, fever, possible aspiration.   HISTORY OF PRESENT ILLNESS: Mr. Jesus [name] is an 81 year old Caucasian male who was just discharged from the hospital after he was admitted on 09/20 and discharged yesterday. His wife brought him back here, stating that after discharge he was very lethargic, slept the entire day, did not eat. She could not feed him. She could not give him his medications. However, there was a moment where she gave him some of his medication when he was more alert. He was spitting out food and retching and at one point he tried to vomit and she feels that he might have aspirated. The patient was shaking at that time and the wife decided to call EMS. The patient was transported here for evaluation. He does not provide much history, has advanced dementia, is sleeping and does not respond to the questions. He was also noticed to be hypotensive and appears to have sepsis. The culprit appears to be a urinary tract infection. Chest x-ray did not show any pneumonia this time. The patient was admitted for further evaluation and management. I would like to mention that the patient is under care of Hospice and he is DO NOT RESUSCITATE status.   PAST MEDICAL HISTORY:  1. Severe vascular dementia. The patient is under the care of Hospice.  2. Chronic obstructive pulmonary disease. 3. Coronary artery disease status post coronary artery bypass graft.  4. Systemic hypertension.  5. Diabetes mellitus, type 2.  6. Hyperlipidemia.  7. Nephrolithiasis.  8. History of transient ischemic attacks.  9. Gastroesophageal reflux. 10. Vitamin B12 deficiency.   PAST SURGICAL HISTORY:  1. Right carotid endarterectomy. 2. Coronary artery bypass graft.  3. Laminectomy.   4. Bilateral inguinal hernia repair.  5. History of tonsillectomy when he was child.   SOCIAL HABITS: Ex chronic smoker. He quit at the age of 81. He drank alcohol only socially in the past.   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 He is married, living with his wife. He is under the care of Hospice.   FAMILY HISTORY: The patient does not provide much history about his family background.   ADMISSION MEDICATIONS:  Recently the patient was discharged on the following medications: 1. Citalopram 10 mg a day.  2. Mirtazapine 15 mg at bedtime.  3. Quetiapine 25 mg, 1 tablet twice a day.  4. Oxybutynin 5 mg twice a day.  5. Temazepam 15 mg at bedtime.  6. Travatan 0.004% ophthalmic solution, one drop in each eye once a day at bedtime.  7. MiraLAX powder once a day.  8. Lorazepam 0.5 mg q. 4 hours p.r.n.  9. Tramadol 50 mg q. 4-6 hours p.r.n. for pain.  10. Protonix suspension 40 mg twice a day.   ALLERGIES: Sulfa.   PHYSICAL EXAMINATION:  VITAL SIGNS: Blood pressure 86/62, respiratory rate 20, pulse 120, temperature 98.7, oxygen saturation on oxygen 97%.   GENERAL APPEARANCE: Elderly male laying in bed sleeping and barely responsive to verbal stimuli.   HEAD: No pallor. No icterus. No cyanosis.   ENT: Hearing cannot be assessed due to the patient's decreased responsiveness. Nasal mucosa, lips, tongue were normal apart from mild dryness of mucous membranes.   EYES: Normal eyelids. Could not  adequately examine the eyes due to the patient's resistance.   NECK: Supple. Trachea at midline. No thyromegaly. No cervical lymphadenopathy. No masses.   HEART: Normal S1, S2. No S3, S4. No murmur. No gallop. No carotid bruits.   RESPIRATORY: Normal breathing pattern without use of accessory muscles. No rales. No wheezing.   ABDOMEN: Soft without tenderness. No hepatosplenomegaly. No masses. No hernias.   SKIN: No ulcers. No  subcutaneous nodules.   MUSCULOSKELETAL: No joint swelling. No clubbing.   NEUROLOGIC: The patient is sleeping. May respond by facial grimacing with stimulation but does not open his eyes or communicate. He is moving his upper extremities and a little of his lower extremities. No facial asymmetry.   PSYCHIATRIC: Unobtainable due to patient's reduced responsiveness.   LABORATORY, DIAGNOSTIC, AND RADIOLOGICAL DATA: Chest x-ray: I did not see any consolidation, no effusion. Serum glucose 142, BUN 13, creatinine 1.7. His baseline creatinine on 09/22 was 1.1. Sodium 145, potassium 3.2. Total protein 6.2, albumin 2.4. Normal liver transaminases. CBC showed white count 16,000, hemoglobin 13, hematocrit 40, platelet count 209. Urinalysis showed cloudy urine with 71 white blood cells.   ASSESSMENT:  1. Altered mental status.  2. Sepsis. It appears that the source is urinary tract infection.  3. Septic shock.  4. Dehydration.  5. Advanced vascular dementia.  6. Coronary artery disease.  7. History of systemic hypertension.  8. History of chronic obstructive pulmonary disease.  9. Hyperlipidemia.  10. Diabetes mellitus, type 2. 11. Previous history of transient ischemic attack. 12. Repetitive retching and vomiting with history of gastroesophageal reflux disease.  13. History of vitamin B12 deficiency.   PLAN:  1. We will admit the patient to telemetry. I ordered an EKG as it was not done.  2. IV normal saline resuscitation, 1 liter then maintenance after that.  3. Blood cultures times two were taken and urine culture.  IV antibiotics using Levaquin pending results of the cultures.  4. I will keep the patient n.p.o. due to reduced responsiveness. The patient cannot swallow now and he is a high risk for aspiration.  5. I discussed with the wife the magnitude of interventions. She is hopeful that even though we will keep him DO NOT RESUSCITATE that he will improve to take him back home under the care  of Hospice. The patient does not have a LIVING WILL, but he had appointed his wife to have the power of attorney. Her name is Constance Holster.   TIME SPENT EVALUATING THIS PATIENT: More than 55 minutes.     ____________________________ Carney Corners. Rudene Re, MD amd:bjt D: 10/22/2011 01:39:57 ET T: 10/22/2011 08:11:40 ET JOB#: [id number]  cc: Carney Corners. Rudene Re, MD, <Dictator> John B. Danne Harbor, MD Karolee Ohs Dala Dock MD ELECTRONICALLY SIGNED 10/22/2011 22:30

## 2014-05-14 NOTE — Discharge Summary (Signed)
PATIENT NAME:  Jesus [name], Jesus [name] MR#:  [id number] DATE OF BIRTH:  02/12/1923  DATE OF ADMISSION:  10/17/2011 DATE OF DISCHARGE:  10/20/2011  HISTORY OF PRESENT ILLNESS: Jesus [name] is an 81 year old severely demented hospice patient who had been in respite while his family was out of town. He was brought to the emergency room by his daughter after the facility called and stated he had had nausea and vomiting for three days. According to the daughter, he had had some hematemesis. In the emergency room, he was noted to have a low-grade fever and a slight tachycardia. He was noted to have very poor skin turgor and dry mucous membranes. The patient was therefore admitted initially to observation for further evaluation.   PAST MEDICAL HISTORY:  1. Severe dementia.  2. Chronic obstructive pulmonary disease.  3. Coronary artery disease.  4. Hypertension.  5. Hyperlipidemia.  6. Diabetes mellitus.  7. Nephrolithiasis.  8. Previous transient ischemic attacks.  9. Severe gastroesophageal reflux disease.  10. B12 deficiency.   PAST SURGICAL HISTORY:  1. Previous coronary artery bypass grafting.  2. Previous right carotid endarterectomy.  3. Bilateral inguinal hernia repairs.  4. Tonsillectomy as a child.  5. Previous laminectomy.   ALLERGIES: Sulfa drugs.   MEDICATIONS ON ADMISSION:  1. Citalopram 10 mg daily.  2. Lorazepam 0.5 mg 1/2 tablet every four hours p.r.n.  3. Remeron 15 mg at bedtime.  4. Actos 15 mg every a.m. 5. Seroquel 25 mg twice a day. 6. Oxybutynin 5 mg twice a day. 7. Temazepam 15 mg at bedtime p.r.n.  8. Tramadol 50 mg every four hours as needed.  9. Travatan eyedrops as directed. 10. MiraLax 1/2 tablespoon daily.   ADMISSION PHYSICAL EXAMINATION: On admission the patient was noted to have a temperature of 100, a pulse of 104, and a blood pressure of 158/99. Examination as described by the admitting physician revealed very poor skin turgor and dry mucous membranes. With  the exception of his severe dementia, the exam was otherwise relatively unremarkable.   LABS/RADIOLOGIC STUDIES: Admission CBC showed a hemoglobin of 15.8 with a hematocrit of 45.8. White count was 9200. Platelet count was 276,000. Admission comprehensive metabolic panel showed an albumin of 3.3 and an estimated GFR of 50, but was otherwise relatively unremarkable. Lipase was 41. Magnesium was 2.2.   Admission urinalysis was unremarkable.   Admission three-way of abdomen with chest showed no acute abnormality.   HOSPITAL COURSE: The patient was admitted initially to observation where he was rehydrated with IV fluids. He was also started on IV Protonix and IV Zofran. A Hemoccult was reportedly negative in the ER. Serial blood counts however showed his hemoglobin to drop to 11.8. He was seen in consultation by gastroenterology. He eventually underwent upper endoscopy which revealed an esophageal stricture that was dilated. No evidence of acute bleeding was noted on upper endoscopy. With rehydration the patient's mental status improved to the point that he was alert and able to follow simple commands. He was able to tolerate a soft diet without difficulty. The patient's final CBC showed that his hemoglobin had stabilized at 11.8. The biopsy of the esophageal stricture came back after discharge and that showed rare Helicobacter organisms. It was negative for dysplasia and malignancy.   DISCHARGE DIAGNOSES:  1. Acute blood loss of undetermined etiology.  2. Nausea and vomiting secondary to esophageal stricture.  3. End-stage dementia.   PROCEDURE: Upper endoscopy with esophageal dilatation.   DISCHARGE MEDICATIONS: 1. Citalopram 10 mg daily.  2. Remeron 15 mg at bedtime.  3. Seroquel 25 mg twice a day. 4. Oxybutynin 5 mg twice a day. 5. Temazepam 15 mg at bedtime.  6. MiraLax 0.5 teaspoons in water once a day.  7. Lorazepam 0.5 mg one tablet every four hours as needed.  8. Tramadol 1 tablet  orally every 4 to 6 hours as needed.  9. Protonix suspension 40 mg twice a day.   NOTE: The patient was taken off of Actos during his hospitalization as his blood sugars were within the normal range due to his poor oral intake.   DISCHARGE DIET: The patient was discharged on a mechanical soft diet as tolerated.   DISPOSITION: He was discharged home with hospice.  ____________________________ Letta Pate. Danne Harbor, MD jbw:slb D: 11/01/2011 20:40:00 ET T: 11/02/2011 08:10:38 ET JOB#: [id number]  cc: Letta Pate. Danne Harbor, MD, <Dictator> Elmo Putt III MD ELECTRONICALLY SIGNED 11/02/2011 10:02

## 2014-05-14 NOTE — Consult Note (Signed)
PATIENT NAME:  Jesus [name], Jesus [name] MR#:  [id number] DATE OF BIRTH:  02/12/1923  DATE OF ADMISSION:  10/15/2011 DATE OF CONSULTATION:  10/18/2011  CONSULTING PHYSICIAN: Ezzard StandingPaul Y. Bluford Kaufmannh, M.D.   REASON FOR REFERRAL: Anemia, coffee-ground emesis.   DESCRIPTION: The patient is an 81 year old white male who was severely demented who apparently has been having difficulty eating and swallowing food lately. He has a history of esophagitis that was documented by endoscopy by Dr. Mechele CollinElliott in 2012. He was taking Carafate and Protonix daily. Because of swelling issues, some of the medication had to be stopped. According to the wife, who was present at bedside both Protonix and Carafate was stopped three weeks ago. The patient was sleeping and was not able to provide any history at all. Then recently he has been having some low-grade fevers and coffee-ground emesis. He is still not eating very well. I was consulted because of drop in hemoglobin from 13 on admission, now  to 11 yesterday.   According to the wife, the patient wakes up periodically and tries to talk.but is confused most of the time. Can get agitated. Sometimes he would spit up food. Other times she would just refuse to eat. It is unclear whether he chokes coughs on swallowing.   PAST MEDICAL HISTORY:  Notable for severe dementia. Other history includes chronic obstructive pulmonary disease coronary artery disease, hypertension, diabetes, history of kidney stones, history of transient ischemic attacks, and reflux and B12 deficiency.   PAST SURGICAL HISTORY:  Coronary artery bypass surgery, carotid endarterectomy inguinal hernia repair, laminectomy.   ALLERGIES: He is allergic to sulfa.   MEDICATIONS AT HOME:  Lorazepam, Remeron, Actos Seroquel, oxybutynin, and tramadol, temazepam, MiraLax and citalopram. According to the wife, he has not had a bowel movement in a week.    PHYSICAL EXAMINATION:   GENERAL: Patient again is kind of sleepy now waking up  very well, he is afebrile this morning with temperature of 97.1, pulse 70.  HEENT: Normocephalic, atraumatic head. Pupils are equally reactive. Throat was clear.   NECK: Supple.   CARDIAC: Regular rhythm and rate without murmurs.   LUNGS: Clear bilaterally.   ABDOMEN: Soft and nontender with decreased bowel sounds. There is no hepatomegaly.   EXTREMITIES: No clubbing, cyanosis, or edema.    NEUROLOGIC: Could not be done because of dementia and person sleeping and not waking up.  IMPRESSION AND PLAN: This is a patient with history of esophagitis with recent coffee-ground emesis and drop in hemoglobin. The patient is back on Protonix. I talked to the wife about maybe repeating the endoscopy to evaluate the esophagus again. Will also look at the rest of the upper GI tract. This will be scheduled later this afternoon with wife's permission.. Thank you for the referral.    ____________________________ Ezzard StandingPaul Y. Bluford Kaufmannh, MD pyo:ljs D: 10/18/2011 09:49:00 ET T: 10/18/2011 11:22:41 ET JOB#: [id number]  cc: Ezzard StandingPaul Y. Bluford Kaufmannh, MD, <Dictator> Ezzard StandingPAUL Y Raeana Blinn MD ELECTRONICALLY SIGNED 10/20/2011 9:11

## 2014-05-14 NOTE — Consult Note (Signed)
Pt seen and examined. Full consult to follow. Severe dementia. Wife present to give hx. Known hx of esophagitis. Last EGD in 2012. Was on both protonix and carafate. Due to not swallowing adequately, both meds stopped about 3 wks ago. Now has vomiting with coffee ground emesis. Hgb steadily dropping. Plan EGD today to evaluate UGI tract. Protonix IV. Consider speech path evaluation to see if patient at risk for aspiration. Will follow. Thanks.  Electronic Signatures: Lutricia Feilh, Bonnye Halle (MD)  (Signed on 23-Sep-13 07:35)  Authored  Last Updated: 23-Sep-13 07:35 by Lutricia Feilh, Mauria Asquith (MD)

## 2014-05-14 NOTE — Consult Note (Signed)
No active bleeding on EGD. No esophagitis thought GE junc stricture present, which may have contributed to not swallowing well. Esophagus dilated to 54 Fr. Gastric polyps also present. These usually do not need to be present. Clear liquid diet ordered again. Advance diet as tolerated and see if dilation helped or not. If no improvement, consider speech evaluation. Thanks.  Electronic Signatures: Lutricia Feilh, Dierra Riesgo (MD)  (Signed on 23-Sep-13 12:15)  Authored  Last Updated: 23-Sep-13 12:15 by Lutricia Feilh, Trevonte Ashkar (MD)

## 2014-05-14 NOTE — Consult Note (Signed)
    Comments   I had a lengthy meeting with pt's wife and daughters Amy and Dorina HoyerJoanie. We discussed transfer to the [facility] in detail. Daughters feel strongly that pt would benefit from this but wife is still conflicted. She is not unrealistic about pt's prognosis but would still like to care for him at home. She will think about options.   Electronic Signatures: Pakou Rainbow, Harriett SineNancy (MD)  (Signed 27-Sep-13 17:22)  Authored: Palliative Care   Last Updated: 27-Sep-13 17:22 by Kendrew Paci, Harriett SineNancy (MD)

## 2014-05-14 NOTE — Discharge Summary (Signed)
PATIENT NAME:  Jesus [name], Jesus [name] MR#:  [id number] DATE OF BIRTH:  02/12/1923  DATE OF ADMISSION:  10/22/2011 DATE OF DISCHARGE:  11/01/2011  HISTORY OF PRESENT ILLNESS: Jesus [name] was an [age] year old white male hospice patient who had just been discharged from the hospital 24 hours previously. The patient got home and the wife was having difficulty feeding him and giving him his medications. He began spitting out his food and retching and at one point vomited and according to the wife probably aspirated. EMS was called and the patient was transported in for reevaluation. He was noted to be hypotensive and appeared septic and was therefore admitted.   PAST MEDICAL HISTORY: Basically unchanged since his previous admission and is most notable for severe vascular dementia for which he was under hospice care.   ADMISSION PHYSICAL EXAMINATION: Examination is well-detailed in the admitting physician's admission note, but was basically unchanged from his previous admission. He was noted to not be in respiratory distress. There was not definite aspiration on admission chest x-ray.   LABS/RADIOLOGIC STUDIES: Admission CBC showed a hemoglobin of 13.6 with a hematocrit of 40.4. White count was 16,500. Platelet count was 209,000. Admission comprehensive metabolic panel showed a random blood sugar of 142. BUN was 13 with a creatinine of 1.77. Potassium was 3.2. Total protein was 6.2. Albumin was 2.4. Estimated GFR was 34.   Admission urinalysis showed 2+ leukocyte esterase on the dipstick.   Subsequent urine culture grew out Klebsiella pneumoniae. The patient also eventually was found to have blood cultures positive for Klebsiella pneumoniae.   HOSPITAL COURSE: The patient was admitted to the regular floor where he was rehydrated with IV fluids. Electrolyte imbalances were corrected. The patient was placed on IV antibiotics to treat his sepsis secondary to Klebsiella pneumoniae. He was also felt possibly to have had  aspiration. Initial chest x-ray showed bibasilar infiltrates. The patient showed some evidence of improvement with some improvement in his mental status, although he never took p.o. well. His catheter came out and had to be reinserted. A repeat urinalysis at that time showed persistent pyuria. A repeat culture showed staph aureus. The patient was treated for an additional five days IV based on the sensitivities. Despite treating the patient's sepsis, possible aspiration, and urinary tract infection his mental status never improved to the point where he was taking p.o. well. He was seen in consultation by palliative care and the wife and daughter eventually agreed to have him transferred to the hospice home.   DISCHARGE DIAGNOSES:  1. Sepsis secondary to Klebsiella urinary tract infection.  2. Evidence of aspiration pneumonia.  3. Staphylococcus urinary tract infection.  4. Endstage vascular dementia. ____________________________ Letta PateJohn [name]. Danne HarborWalker III, MD jbw:slb D: 11/11/2011 08:25:41 ET T: 11/11/2011 17:07:00 ET JOB#: [id number]  cc: Letta PateJohn [name]. Danne HarborWalker III, MD, <Dictator> Jesus PuttJOHN [name] WALKER III MD ELECTRONICALLY SIGNED 11/12/2011 8:27

## 2014-05-14 NOTE — H&P (Signed)
PATIENT NAME:  Jesus [name], Jesus [name] MR#:  [id number] DATE OF BIRTH:  02/12/1923  DATE OF ADMISSION:  10/15/2011  HISTORY OF PRESENT ILLNESS:  Jesus [name] is an 81 year old severely demented gentleman who had been in respite while his family was out of town this week. He was brought to the Emergency Room by his daughter after the facility called and stated that he had had nausea and vomiting for three days and had held no solid food down. In the Emergency Room, the patient was noted to be severely demented but his exam was otherwise unchanged. He did have a low-grade temperature and a slight tachycardia. He had a very poor skin turgor and a dry mucous membrane even though his lab was not particularly remarkable. The patient is, therefore, being admitted for observation where he will be placed on clear liquids, IV Protonix and IV Zofran.   PAST MEDICAL HISTORY:  1. Severe dementia for which the patient has been on hospice.  2. Chronic obstructive pulmonary disease.  3. Coronary artery disease.  4. Hypertension.  5. Hyperlipidemia.  6. Diabetes mellitus.  7. Nephrolithiasis.  8. Previous transient ischemic attacks.  9. Severe gastroesophageal reflux disease.  10. B12 deficiency. 11. Peripheral anemia.  12. History of B12 deficiency.   PAST SURGICAL HISTORY:  1. Coronary artery bypass graft.  2. Right carotid endarterectomy in the distant past.   3. Laminectomy.  4. Bilateral inguinal hernia repairs in the past.  5. Tonsillectomy as a child.   ALLERGIES: The patient is noted to be allergic to sulfa.   ADMISSION MEDICATIONS:  1. Citalopram 10 mg daily. 2. Lorazepam 0.5 mg 1/2 tablet every four hours p.r.n.  3. Remeron 15 mg at bedtime.  4. Actos 15 mg each morning.  5. Seroquel 25 mg [name].i.d.  6. Oxybutynin 5 mg [name].i.d.  7. Temazepam 15 mg at bedtime p.r.n.  8. Tramadol 50 mg every four hours as needed. 9. Travatan eyedrops. 10. MiraLAX 1/2 tsp daily.   ADMISSION PHYSICAL EXAMINATION:   VITAL SIGNS: Temperature 100, pulse of 158/99, pulse 104.   GENERAL: This is a severely demented elderly gentleman who is in no acute distress, but looks extremely washed out. He cannot give a history. Most of the history was obtained from the daughter. He has multiple ecchymoses on his arms and legs. He has extremely poor skin turgor. No lymphadenopathy is noted.   HEENT: Examination of head, ears, eyes, nose and throat was notable for arterial narrowing on the funduscopic exam. He had extremely dry mucous membranes.   NECK: The neck is supple.   LUNGS: The lung fields are clear. There is a regular rhythm without murmur or gallop. S1, S2 were normal.   ABDOMEN: Soft and nontender. There are no masses or organomegaly. Bowel sounds are hypoactive.   GENITAL/RECTAL: Deferred on admission.   EXTREMITIES: No edema. He has generalized changes of osteoarthritis.   NEUROLOGIC: Examination was most notable for his severe dementia. No focal neurological findings were noted.   PLAN: The patient will be admitted initially to observation because of his basically normal laboratory work despite an exam that suggests dehydration. He will be placed on a clear liquid diet. He will be placed on IV Protonix. His nausea will be treated with IV Zofran. The patient will initially be placed on a clear liquid diet which will be progressed as possible. The source of his fever and tachycardia was unclear. He had some symptoms of urinary retention and a catheter will be placed.  Urinalysis will be obtained once the catheter is placed.   The patient is a NO CODE.   ____________________________ Letta Pate. Danne Harbor, MD [id number] D: 10/15/2011 19:52:26 ET T: 10/16/2011 07:10:42 ET JOB#: [id number]  cc: Jesus [name]. Danne Harbor, MD, <Dictator> Elmo Putt III MD ELECTRONICALLY SIGNED 10/17/2011 15:58
